# Patient Record
Sex: Male | Born: 1985 | Race: White | Hispanic: No | Marital: Married | Smoking: Never smoker
Health system: Southern US, Community
[De-identification: ages and names within clinical notes are randomized; demographics above are authoritative.]

---

## 2010-10-10 ENCOUNTER — Encounter: Admission: RE | Admit: 2010-10-10 | Discharge: 2010-10-10 | Payer: Self-pay | Admitting: Emergency Medicine

## 2014-05-10 ENCOUNTER — Encounter (HOSPITAL_COMMUNITY)
Admission: EM | Disposition: A | Discharge status: Home / Self Care | Payer: Self-pay | Source: Home / Self Care | Attending: Emergency Medicine

## 2014-05-10 ENCOUNTER — Encounter (HOSPITAL_COMMUNITY): Payer: Self-pay | Admitting: Emergency Medicine

## 2014-05-10 ENCOUNTER — Ambulatory Visit (HOSPITAL_COMMUNITY)
Admission: EM | Admit: 2014-05-10 | Discharge: 2014-05-11 | Disposition: A | Discharge status: Home / Self Care | Payer: 59 | Attending: General Surgery | Admitting: General Surgery

## 2014-05-10 ENCOUNTER — Emergency Department (HOSPITAL_COMMUNITY): Payer: 59

## 2014-05-10 ENCOUNTER — Encounter (HOSPITAL_COMMUNITY): Payer: 59 | Admitting: Certified Registered Nurse Anesthetist

## 2014-05-10 ENCOUNTER — Emergency Department (HOSPITAL_COMMUNITY): Payer: 59 | Admitting: Certified Registered Nurse Anesthetist

## 2014-05-10 DIAGNOSIS — K3580 Unspecified acute appendicitis: Secondary | ICD-10-CM | POA: Diagnosis present

## 2014-05-10 DIAGNOSIS — R109 Unspecified abdominal pain: Secondary | ICD-10-CM

## 2014-05-10 DIAGNOSIS — K5900 Constipation, unspecified: Secondary | ICD-10-CM

## 2014-05-10 DIAGNOSIS — K353 Acute appendicitis with localized peritonitis, without perforation or gangrene: Secondary | ICD-10-CM

## 2014-05-10 HISTORY — PX: LAPAROSCOPIC APPENDECTOMY: SHX408

## 2014-05-10 LAB — URINALYSIS, ROUTINE W REFLEX MICROSCOPIC
BILIRUBIN URINE: NEGATIVE
Glucose, UA: NEGATIVE mg/dL
HGB URINE DIPSTICK: NEGATIVE
Ketones, ur: NEGATIVE mg/dL
Leukocytes, UA: NEGATIVE
NITRITE: NEGATIVE
PROTEIN: 30 mg/dL — AB
SPECIFIC GRAVITY, URINE: 1.023 (ref 1.005–1.030)
UROBILINOGEN UA: 0.2 mg/dL (ref 0.0–1.0)
pH: 8.5 — ABNORMAL HIGH (ref 5.0–8.0)

## 2014-05-10 LAB — CBC WITH DIFFERENTIAL/PLATELET
Basophils Absolute: 0 10*3/uL (ref 0.0–0.1)
Basophils Relative: 0 % (ref 0–1)
EOS PCT: 1 % (ref 0–5)
Eosinophils Absolute: 0.1 10*3/uL (ref 0.0–0.7)
HEMATOCRIT: 43.2 % (ref 39.0–52.0)
HEMOGLOBIN: 15.2 g/dL (ref 13.0–17.0)
LYMPHS ABS: 1.2 10*3/uL (ref 0.7–4.0)
LYMPHS PCT: 9 % — AB (ref 12–46)
MCH: 29.5 pg (ref 26.0–34.0)
MCHC: 35.2 g/dL (ref 30.0–36.0)
MCV: 83.7 fL (ref 78.0–100.0)
MONO ABS: 0.9 10*3/uL (ref 0.1–1.0)
Monocytes Relative: 6 % (ref 3–12)
NEUTROS ABS: 11.9 10*3/uL — AB (ref 1.7–7.7)
Neutrophils Relative %: 84 % — ABNORMAL HIGH (ref 43–77)
Platelets: 176 10*3/uL (ref 150–400)
RBC: 5.16 MIL/uL (ref 4.22–5.81)
RDW: 11.9 % (ref 11.5–15.5)
WBC: 14.1 10*3/uL — AB (ref 4.0–10.5)

## 2014-05-10 LAB — COMPREHENSIVE METABOLIC PANEL
ALT: 50 U/L (ref 0–53)
AST: 22 U/L (ref 0–37)
Albumin: 4.5 g/dL (ref 3.5–5.2)
Alkaline Phosphatase: 68 U/L (ref 39–117)
BILIRUBIN TOTAL: 0.8 mg/dL (ref 0.3–1.2)
BUN: 11 mg/dL (ref 6–23)
CALCIUM: 9.5 mg/dL (ref 8.4–10.5)
CHLORIDE: 100 meq/L (ref 96–112)
CO2: 23 meq/L (ref 19–32)
CREATININE: 1.02 mg/dL (ref 0.50–1.35)
GLUCOSE: 112 mg/dL — AB (ref 70–99)
Potassium: 4 mEq/L (ref 3.7–5.3)
Sodium: 139 mEq/L (ref 137–147)
Total Protein: 7.6 g/dL (ref 6.0–8.3)

## 2014-05-10 LAB — URINE MICROSCOPIC-ADD ON

## 2014-05-10 SURGERY — APPENDECTOMY, LAPAROSCOPIC
Anesthesia: General | Site: Abdomen

## 2014-05-10 MED ORDER — MORPHINE SULFATE 4 MG/ML IJ SOLN
4.0000 mg | Freq: Once | INTRAMUSCULAR | Status: AC
  Administered 2014-05-10: 4 mg via INTRAVENOUS
  Filled 2014-05-10: qty 1

## 2014-05-10 MED ORDER — DEXAMETHASONE SODIUM PHOSPHATE 10 MG/ML IJ SOLN
INTRAMUSCULAR | Status: DC | PRN
  Administered 2014-05-10: 10 mg via INTRAVENOUS

## 2014-05-10 MED ORDER — ONDANSETRON HCL 4 MG/2ML IJ SOLN
4.0000 mg | Freq: Once | INTRAMUSCULAR | Status: AC
  Administered 2014-05-10: 4 mg via INTRAVENOUS
  Filled 2014-05-10: qty 2

## 2014-05-10 MED ORDER — FENTANYL CITRATE 0.05 MG/ML IJ SOLN
INTRAMUSCULAR | Status: AC
  Filled 2014-05-10: qty 5

## 2014-05-10 MED ORDER — MORPHINE SULFATE 2 MG/ML IJ SOLN
1.0000 mg | INTRAMUSCULAR | Status: DC | PRN
Start: 2014-05-10 — End: 2014-05-10

## 2014-05-10 MED ORDER — MEPERIDINE HCL 50 MG/ML IJ SOLN: 6.2500 mg | INTRAMUSCULAR | Status: DC | PRN

## 2014-05-10 MED ORDER — LACTATED RINGERS IV SOLN: INTRAVENOUS | Status: DC

## 2014-05-10 MED ORDER — PROPOFOL 10 MG/ML IV BOLUS
INTRAVENOUS | Status: AC
  Filled 2014-05-10: qty 20

## 2014-05-10 MED ORDER — PIPERACILLIN-TAZOBACTAM 3.375 G IVPB
3.3750 g | Freq: Three times a day (TID) | INTRAVENOUS | Status: DC
  Administered 2014-05-10 – 2014-05-11 (×2): 3.375 g via INTRAVENOUS
  Filled 2014-05-10 (×3): qty 50

## 2014-05-10 MED ORDER — FENTANYL CITRATE 0.05 MG/ML IJ SOLN
25.0000 ug | INTRAMUSCULAR | Status: DC | PRN
  Administered 2014-05-10 (×2): 50 ug via INTRAVENOUS

## 2014-05-10 MED ORDER — LACTATED RINGERS IR SOLN
Status: DC | PRN
  Administered 2014-05-10: 1000 mL

## 2014-05-10 MED ORDER — ONDANSETRON HCL 4 MG/2ML IJ SOLN: 4.0000 mg | Freq: Four times a day (QID) | INTRAMUSCULAR | Status: DC | PRN

## 2014-05-10 MED ORDER — HYDROMORPHONE HCL PF 1 MG/ML IJ SOLN: 1.0000 mg | INTRAMUSCULAR | Status: DC | PRN

## 2014-05-10 MED ORDER — NEOSTIGMINE METHYLSULFATE 10 MG/10ML IV SOLN
INTRAVENOUS | Status: DC | PRN
  Administered 2014-05-10: 5 mg via INTRAVENOUS

## 2014-05-10 MED ORDER — BUPIVACAINE-EPINEPHRINE (PF) 0.25% -1:200000 IJ SOLN
INTRAMUSCULAR | Status: AC
  Filled 2014-05-10: qty 30

## 2014-05-10 MED ORDER — LIDOCAINE HCL (CARDIAC) 20 MG/ML IV SOLN
INTRAVENOUS | Status: AC
  Filled 2014-05-10: qty 5

## 2014-05-10 MED ORDER — LACTATED RINGERS IV SOLN
INTRAVENOUS | Status: DC | PRN
  Administered 2014-05-10 (×2): via INTRAVENOUS

## 2014-05-10 MED ORDER — ONDANSETRON HCL 4 MG PO TABS
4.0000 mg | ORAL_TABLET | Freq: Four times a day (QID) | ORAL | Status: DC | PRN
Start: 2014-05-10 — End: 2014-05-11

## 2014-05-10 MED ORDER — ONDANSETRON HCL 4 MG/2ML IJ SOLN
INTRAMUSCULAR | Status: AC
  Filled 2014-05-10: qty 2

## 2014-05-10 MED ORDER — PIPERACILLIN-TAZOBACTAM 3.375 G IVPB
3.3750 g | INTRAVENOUS | Status: AC
  Administered 2014-05-10: 3.375 g via INTRAVENOUS
  Filled 2014-05-10: qty 50

## 2014-05-10 MED ORDER — FENTANYL CITRATE 0.05 MG/ML IJ SOLN
INTRAMUSCULAR | Status: AC
  Filled 2014-05-10: qty 2

## 2014-05-10 MED ORDER — OXYCODONE-ACETAMINOPHEN 5-325 MG PO TABS
1.0000 | ORAL_TABLET | ORAL | Status: DC | PRN
  Administered 2014-05-10 – 2014-05-11 (×2): 2 via ORAL
  Filled 2014-05-10 (×2): qty 2

## 2014-05-10 MED ORDER — DEXAMETHASONE SODIUM PHOSPHATE 10 MG/ML IJ SOLN
INTRAMUSCULAR | Status: AC
  Filled 2014-05-10: qty 1

## 2014-05-10 MED ORDER — GLYCOPYRROLATE 0.2 MG/ML IJ SOLN
INTRAMUSCULAR | Status: AC
  Filled 2014-05-10: qty 3

## 2014-05-10 MED ORDER — POTASSIUM CHLORIDE IN NACL 20-0.9 MEQ/L-% IV SOLN
INTRAVENOUS | Status: DC
  Administered 2014-05-11: via INTRAVENOUS
  Filled 2014-05-10 (×4): qty 1000

## 2014-05-10 MED ORDER — FENTANYL CITRATE 0.05 MG/ML IJ SOLN
INTRAMUSCULAR | Status: DC | PRN
  Administered 2014-05-10 (×5): 50 ug via INTRAVENOUS

## 2014-05-10 MED ORDER — LIDOCAINE HCL (CARDIAC) 20 MG/ML IV SOLN
INTRAVENOUS | Status: DC | PRN
  Administered 2014-05-10: 100 mg via INTRAVENOUS

## 2014-05-10 MED ORDER — 0.9 % SODIUM CHLORIDE (POUR BTL) OPTIME
TOPICAL | Status: DC | PRN
  Administered 2014-05-10: 1000 mL

## 2014-05-10 MED ORDER — MIDAZOLAM HCL 2 MG/2ML IJ SOLN
INTRAMUSCULAR | Status: AC
  Filled 2014-05-10: qty 2

## 2014-05-10 MED ORDER — NEOSTIGMINE METHYLSULFATE 10 MG/10ML IV SOLN
INTRAVENOUS | Status: AC
  Filled 2014-05-10: qty 1

## 2014-05-10 MED ORDER — PROMETHAZINE HCL 25 MG/ML IJ SOLN: 6.2500 mg | INTRAMUSCULAR | Status: DC | PRN

## 2014-05-10 MED ORDER — GLYCOPYRROLATE 0.2 MG/ML IJ SOLN
INTRAMUSCULAR | Status: DC | PRN
  Administered 2014-05-10: 0.6 mg via INTRAVENOUS

## 2014-05-10 MED ORDER — ROCURONIUM BROMIDE 100 MG/10ML IV SOLN
INTRAVENOUS | Status: AC
  Filled 2014-05-10: qty 1

## 2014-05-10 MED ORDER — BUPIVACAINE-EPINEPHRINE (PF) 0.5% -1:200000 IJ SOLN
INTRAMUSCULAR | Status: AC
  Filled 2014-05-10: qty 30

## 2014-05-10 MED ORDER — SUCCINYLCHOLINE CHLORIDE 20 MG/ML IJ SOLN
INTRAMUSCULAR | Status: DC | PRN
  Administered 2014-05-10: 100 mg via INTRAVENOUS

## 2014-05-10 MED ORDER — PROPOFOL 10 MG/ML IV BOLUS
INTRAVENOUS | Status: DC | PRN
  Administered 2014-05-10: 200 mg via INTRAVENOUS

## 2014-05-10 MED ORDER — BUPIVACAINE-EPINEPHRINE (PF) 0.5% -1:200000 IJ SOLN
INTRAMUSCULAR | Status: DC | PRN
  Administered 2014-05-10: 15 mL

## 2014-05-10 MED ORDER — ENOXAPARIN SODIUM 40 MG/0.4ML ~~LOC~~ SOLN
40.0000 mg | SUBCUTANEOUS | Status: DC
  Administered 2014-05-11: 40 mg via SUBCUTANEOUS
  Filled 2014-05-10 (×2): qty 0.4

## 2014-05-10 MED ORDER — ROCURONIUM BROMIDE 100 MG/10ML IV SOLN
INTRAVENOUS | Status: DC | PRN
  Administered 2014-05-10: 10 mg via INTRAVENOUS
  Administered 2014-05-10: 35 mg via INTRAVENOUS

## 2014-05-10 MED ORDER — ONDANSETRON HCL 4 MG/2ML IJ SOLN
INTRAMUSCULAR | Status: DC | PRN
  Administered 2014-05-10: 4 mg via INTRAVENOUS

## 2014-05-10 MED ORDER — MIDAZOLAM HCL 5 MG/5ML IJ SOLN
INTRAMUSCULAR | Status: DC | PRN
  Administered 2014-05-10: 2 mg via INTRAVENOUS

## 2014-05-10 MED ORDER — SODIUM CHLORIDE 0.9 % IV BOLUS (SEPSIS)
1000.0000 mL | Freq: Once | INTRAVENOUS | Status: AC
  Administered 2014-05-10: 1000 mL via INTRAVENOUS

## 2014-05-10 SURGICAL SUPPLY — 38 items
APPLIER CLIP ROT 10 11.4 M/L (STAPLE)
CANISTER SUCTION 2500CC (MISCELLANEOUS) ×3 IMPLANT
CLIP APPLIE ROT 10 11.4 M/L (STAPLE) IMPLANT
CLOSURE WOUND 1/2 X4 (GAUZE/BANDAGES/DRESSINGS)
CUTTER FLEX LINEAR 45M (STAPLE) ×3 IMPLANT
DECANTER SPIKE VIAL GLASS SM (MISCELLANEOUS) ×3 IMPLANT
DERMABOND ADVANCED (GAUZE/BANDAGES/DRESSINGS) ×2
DERMABOND ADVANCED .7 DNX12 (GAUZE/BANDAGES/DRESSINGS) ×1 IMPLANT
DEVICE TROCAR PUNCTURE CLOSURE (ENDOMECHANICALS) IMPLANT
DRAPE LAPAROSCOPIC ABDOMINAL (DRAPES) ×3 IMPLANT
ELECT REM PT RETURN 9FT ADLT (ELECTROSURGICAL) ×3
ELECTRODE REM PT RTRN 9FT ADLT (ELECTROSURGICAL) ×1 IMPLANT
ENDOLOOP SUT PDS II  0 18 (SUTURE)
ENDOLOOP SUT PDS II 0 18 (SUTURE) IMPLANT
GLOVE BIOGEL PI IND STRL 7.0 (GLOVE) ×1 IMPLANT
GLOVE BIOGEL PI INDICATOR 7.0 (GLOVE) ×2
GLOVE EUDERMIC 7 POWDERFREE (GLOVE) ×3 IMPLANT
GOWN STRL REUS W/TWL LRG LVL3 (GOWN DISPOSABLE) ×3 IMPLANT
GOWN STRL REUS W/TWL XL LVL3 (GOWN DISPOSABLE) ×6 IMPLANT
KIT BASIN OR (CUSTOM PROCEDURE TRAY) ×3 IMPLANT
PENCIL BUTTON HOLSTER BLD 10FT (ELECTRODE) ×3 IMPLANT
POUCH SPECIMEN RETRIEVAL 10MM (ENDOMECHANICALS) ×3 IMPLANT
RELOAD 45 VASCULAR/THIN (ENDOMECHANICALS) ×3 IMPLANT
RELOAD STAPLE TA45 3.5 REG BLU (ENDOMECHANICALS) IMPLANT
SET IRRIG TUBING LAPAROSCOPIC (IRRIGATION / IRRIGATOR) ×3 IMPLANT
SHEARS HARMONIC ACE PLUS 36CM (ENDOMECHANICALS) IMPLANT
SOLUTION ANTI FOG 6CC (MISCELLANEOUS) ×3 IMPLANT
STRIP CLOSURE SKIN 1/2X4 (GAUZE/BANDAGES/DRESSINGS) IMPLANT
SUT MNCRL AB 4-0 PS2 18 (SUTURE) ×3 IMPLANT
SUT VICRYL 0 UR6 27IN ABS (SUTURE) IMPLANT
TOWEL OR 17X26 10 PK STRL BLUE (TOWEL DISPOSABLE) ×3 IMPLANT
TRAY FOLEY CATH 14FRSI W/METER (CATHETERS) IMPLANT
TRAY FOLEY CATH 16FR SILVER (SET/KITS/TRAYS/PACK) ×3 IMPLANT
TRAY LAP CHOLE (CUSTOM PROCEDURE TRAY) ×3 IMPLANT
TROCAR BLADELESS OPT 5 75 (ENDOMECHANICALS) ×3 IMPLANT
TROCAR XCEL 12X100 BLDLESS (ENDOMECHANICALS) ×3 IMPLANT
TROCAR XCEL BLUNT TIP 100MML (ENDOMECHANICALS) ×3 IMPLANT
TUBING INSUFFLATION 10FT LAP (TUBING) ×3 IMPLANT

## 2014-05-10 NOTE — Progress Notes (Signed)
CARE MANAGEMENT ED NOTE 05/10/2014  Patient:  Craig Clark,Craig Clark   Account Number:  [id number]  Date Initiated:  05/10/2014  Documentation initiated by:  Edd ArbourGIBBS,KIMBERLY  Subjective/Objective Assessment:   28 yr old [place] pt c/o abdominal pain x 12 hrs, nausea Dx acute appendicitis to OR with Dr Edythe LynnH Ingram     Subjective/Objective Assessment Detail:   No pcp per pt Pt left [facility] ED prior to being given a list of pcps     Action/Plan:   ED CM spoke with pt CM discussed need closer to d/c  to find a pcp for f/u care   Action/Plan Detail:   Anticipated DC Date:       Status Recommendation to Physician:   Result of Recommendation:    Other ED Services  Consult Working Plan    DC Planning Services  Other  PCP issues  Outpatient Services - Pt will follow up    Choice offered to / List presented to:            Status of service:  Completed, signed off  ED Comments:   ED Comments Detail:

## 2014-05-10 NOTE — ED Provider Notes (Signed)
CSN: [id number]     Arrival date & time 05/10/14  [id number] History   First MD Initiated Contact with Patient 05/10/14 [phone]     Chief Complaint  Patient presents with  . Abdominal Pain     (Consider location/radiation/quality/duration/timing/severity/associated sxs/prior Treatment) HPI Comments: Pt presents with right side abd pain.  States that it started last night, about 12 hours ago, has been constant, but waxes and wanes in intensity.  Mild nausea, but no vomiting.  No urinary symptoms.  No fevers or chills.  Pain is achey and radiates at times to back and right groin.  Has been worse with ambulation.  No hx of abd surgery, no hx of kidney stones.  Has not taken anything for the pain.  Patient is a 28 y.o. male presenting with abdominal pain.  Abdominal Pain Associated symptoms: nausea   Associated symptoms: no chest pain, no chills, no cough, no diarrhea, no fatigue, no fever, no hematuria, no shortness of breath and no vomiting     History reviewed. No pertinent past medical history. History reviewed. No pertinent past surgical history. No family history on file. History  Substance Use Topics  . Smoking status: Never Smoker   . Smokeless tobacco: Not on file  . Alcohol Use: No    Review of Systems  Constitutional: Negative for fever, chills, diaphoresis and fatigue.  HENT: Negative for congestion, rhinorrhea and sneezing.   Eyes: Negative.   Respiratory: Negative for cough, chest tightness and shortness of breath.   Cardiovascular: Negative for chest pain and leg swelling.  Gastrointestinal: Positive for nausea and abdominal pain. Negative for vomiting, diarrhea and blood in stool.  Genitourinary: Negative for frequency, hematuria, flank pain and difficulty urinating.  Musculoskeletal: Negative for arthralgias and back pain.  Skin: Negative for rash.  Neurological: Negative for dizziness, speech difficulty, weakness, numbness and headaches.      Allergies   Iohexol  Home Medications   Prior to Admission medications   Medication Sig Start Date End Date Taking? Authorizing Provider  acetaminophen (TYLENOL) 500 MG tablet Take 500 mg by mouth every 6 (six) hours as needed.   Yes Historical Provider, MD  Aspirin-Acetaminophen-Caffeine (EXCEDRIN PO) Take by mouth.   Yes Historical Provider, MD  hydrocortisone cream 1 % Apply 1 application topically as needed for itching (rash).   Yes Historical Provider, MD  ibuprofen (ADVIL,MOTRIN) 200 MG tablet Take 200 mg by mouth every 6 (six) hours as needed for mild pain.   Yes Historical Provider, MD   BP 121/69  Pulse 78  Temp(Src) 98.5 F (36.9 C) (Oral)  Resp 16  SpO2 100% Physical Exam  Constitutional: He is oriented to person, place, and time. He appears well-developed and well-nourished.  HENT:  Head: Normocephalic and atraumatic.  Eyes: Pupils are equal, round, and reactive to light.  Neck: Normal range of motion. Neck supple.  Cardiovascular: Normal rate, regular rhythm and normal heart sounds.   Pulmonary/Chest: Effort normal and breath sounds normal. No respiratory distress. He has no wheezes. He has no rales. He exhibits no tenderness.  Abdominal: Soft. Bowel sounds are normal. There is tenderness (+TTP RLQ, mild right flank tenderness). There is no rebound and no guarding.  Genitourinary:  No pain over inguinal area or testicle.  Musculoskeletal: Normal range of motion. He exhibits no edema.  Lymphadenopathy:    He has no cervical adenopathy.  Neurological: He is alert and oriented to person, place, and time.  Skin: Skin is warm and dry. No rash noted.  Psychiatric: He has a normal mood and affect.    ED Course  Procedures (including critical care time) Labs Review Results for orders placed during the hospital encounter of 05/10/14  CBC WITH DIFFERENTIAL      Result Value Ref Range   WBC 14.1 (*) 4.0 - 10.5 K/uL   RBC 5.16  4.22 - 5.81 MIL/uL   Hemoglobin 15.2  13.0 - 17.0 g/dL    HCT 16.1  09.6 - 04.5 %   MCV 83.7  78.0 - 100.0 fL   MCH 29.5  26.0 - 34.0 pg   MCHC 35.2  30.0 - 36.0 g/dL   RDW 40.9  81.1 - 91.4 %   Platelets 176  150 - 400 K/uL   Neutrophils Relative % 84 (*) 43 - 77 %   Neutro Abs 11.9 (*) 1.7 - 7.7 K/uL   Lymphocytes Relative 9 (*) 12 - 46 %   Lymphs Abs 1.2  0.7 - 4.0 K/uL   Monocytes Relative 6  3 - 12 %   Monocytes Absolute 0.9  0.1 - 1.0 K/uL   Eosinophils Relative 1  0 - 5 %   Eosinophils Absolute 0.1  0.0 - 0.7 K/uL   Basophils Relative 0  0 - 1 %   Basophils Absolute 0.0  0.0 - 0.1 K/uL  COMPREHENSIVE METABOLIC PANEL      Result Value Ref Range   Sodium 139  137 - 147 mEq/L   Potassium 4.0  3.7 - 5.3 mEq/L   Chloride 100  96 - 112 mEq/L   CO2 23  19 - 32 mEq/L   Glucose, Bld 112 (*) 70 - 99 mg/dL   BUN 11  6 - 23 mg/dL   Creatinine, Ser 7.82  0.50 - 1.35 mg/dL   Calcium 9.5  8.4 - 95.6 mg/dL   Total Protein 7.6  6.0 - 8.3 g/dL   Albumin 4.5  3.5 - 5.2 g/dL   AST 22  0 - 37 U/L   ALT 50  0 - 53 U/L   Alkaline Phosphatase 68  39 - 117 U/L   Total Bilirubin 0.8  0.3 - 1.2 mg/dL   GFR calc non Af Amer >90  >90 mL/min   GFR calc Af Amer >90  >90 mL/min  URINALYSIS, ROUTINE W REFLEX MICROSCOPIC      Result Value Ref Range   Color, Urine YELLOW  YELLOW   APPearance CLEAR  CLEAR   Specific Gravity, Urine 1.023  1.005 - 1.030   pH 8.5 (*) 5.0 - 8.0   Glucose, UA NEGATIVE  NEGATIVE mg/dL   Hgb urine dipstick NEGATIVE  NEGATIVE   Bilirubin Urine NEGATIVE  NEGATIVE   Ketones, ur NEGATIVE  NEGATIVE mg/dL   Protein, ur 30 (*) NEGATIVE mg/dL   Urobilinogen, UA 0.2  0.0 - 1.0 mg/dL   Nitrite NEGATIVE  NEGATIVE   Leukocytes, UA NEGATIVE  NEGATIVE  URINE MICROSCOPIC-ADD ON      Result Value Ref Range   WBC, UA 0-2  <3 WBC/hpf   RBC / HPF 3-6  <3 RBC/hpf   Ct Abdomen Pelvis Wo Contrast  05/10/2014   CLINICAL DATA:  Flank pain.  EXAM: CT ABDOMEN AND PELVIS WITHOUT CONTRAST  TECHNIQUE: Multidetector CT imaging of the abdomen and  pelvis was performed following the standard protocol without IV contrast.  COMPARISON:  CT 10/10/2010.  FINDINGS: Liver normal. Spleen normal. Pancreas normal. No biliary distention. The gallbladder is nondistended.  Adrenals normal. Kidneys normal. No  hydronephrosis or obstructing ureteral stone. Bladder is nondistended. Prostate is not enlarged.  No significant adenopathy.  Abdominal aorta normal caliber.  Appendix is distended to 1.3 cm. Periappendiceal inflammatory change present. These findings are consistent with appendicitis. No evidence of bowel obstruction. No free air. No mesenteric mass. No significant hernia. Small inguinal hernias with herniation of fat only noted.  Heart size normal. Mild atelectasis left lung base. No acute bony abnormality.  IMPRESSION: Appendicitis.   Electronically Signed   By: Maisie Fushomas  Register   On: 05/10/2014 10:54      Imaging Review Ct Abdomen Pelvis Wo Contrast  05/10/2014   CLINICAL DATA:  Flank pain.  EXAM: CT ABDOMEN AND PELVIS WITHOUT CONTRAST  TECHNIQUE: Multidetector CT imaging of the abdomen and pelvis was performed following the standard protocol without IV contrast.  COMPARISON:  CT 10/10/2010.  FINDINGS: Liver normal. Spleen normal. Pancreas normal. No biliary distention. The gallbladder is nondistended.  Adrenals normal. Kidneys normal. No hydronephrosis or obstructing ureteral stone. Bladder is nondistended. Prostate is not enlarged.  No significant adenopathy.  Abdominal aorta normal caliber.  Appendix is distended to 1.3 cm. Periappendiceal inflammatory change present. These findings are consistent with appendicitis. No evidence of bowel obstruction. No free air. No mesenteric mass. No significant hernia. Small inguinal hernias with herniation of fat only noted.  Heart size normal. Mild atelectasis left lung base. No acute bony abnormality.  IMPRESSION: Appendicitis.   Electronically Signed   By: Maisie Fushomas  Register   On: 05/10/2014 10:54     EKG  Interpretation None      MDM   Final diagnoses:  Acute appendicitis with localized peritonitis    Pt with appendicitis.  Surgery notified and will see pt.    Rolan BuccoMelanie Belfi, MD 05/10/14 1143

## 2014-05-10 NOTE — Anesthesia Preprocedure Evaluation (Signed)
Anesthesia Evaluation  Patient identified by MRN, date of birth, ID band Patient awake    Reviewed: Allergy & Precautions, H&P , NPO status , Patient's Chart, lab work & pertinent test results  Airway Mallampati: II TM Distance: >3 FB Neck ROM: full    Dental no notable dental hx.    Pulmonary neg pulmonary ROS,  breath sounds clear to auscultation  Pulmonary exam normal       Cardiovascular Exercise Tolerance: Good negative cardio ROS  Rhythm:regular Rate:Normal     Neuro/Psych negative neurological ROS  negative psych ROS   GI/Hepatic negative GI ROS, Neg liver ROS,   Endo/Other  negative endocrine ROS  Renal/GU negative Renal ROS  negative genitourinary   Musculoskeletal   Abdominal   Peds  Hematology negative hematology ROS (+)   Anesthesia Other Findings   Reproductive/Obstetrics negative OB ROS                           Anesthesia Physical Anesthesia Plan  ASA: I and emergent  Anesthesia Plan: General and General ETT   Post-op Pain Management:    Induction:   Airway Management Planned:   Additional Equipment:   Intra-op Plan:   Post-operative Plan:   Informed Consent: I have reviewed the patients History and Physical, chart, labs and discussed the procedure including the risks, benefits and alternatives for the proposed anesthesia with the patient or authorized representative who has indicated his/her understanding and acceptance.   Dental Advisory Given  Plan Discussed with: CRNA  Anesthesia Plan Comments:         Anesthesia Quick Evaluation

## 2014-05-10 NOTE — ED Notes (Signed)
Patient transported to Ultrasound 

## 2014-05-10 NOTE — Anesthesia Postprocedure Evaluation (Signed)
  Anesthesia Post-op Note  Patient: Craig SaltsDavid Clark  Procedure(s) Performed: Procedure(s) (LRB): APPENDECTOMY LAPAROSCOPIC (N/A)  Patient Location: PACU  Anesthesia Type: General  Level of Consciousness: awake and alert   Airway and Oxygen Therapy: Patient Spontanous Breathing  Post-op Pain: mild  Post-op Assessment: Post-op Vital signs reviewed, Patient's Cardiovascular Status Stable, Respiratory Function Stable, Patent Airway and No signs of Nausea or vomiting  Last Vitals:  Filed Vitals:   05/10/14 1614  BP: 115/67  Pulse: 70  Temp: 37 C  Resp: 16    Post-op Vital Signs: stable   Complications: No apparent anesthesia complications

## 2014-05-10 NOTE — Transfer of Care (Signed)
Immediate Anesthesia Transfer of Care Note  Patient: Craig SaltsDavid Hofferber  Procedure(s) Performed: Procedure(s) (LRB): APPENDECTOMY LAPAROSCOPIC (N/A)  Patient Location: PACU  Anesthesia Type: General  Level of Consciousness: sedated, patient cooperative and responds to stimulation  Airway & Oxygen Therapy: Patient Spontanous Breathing and Patient connected to face mask oxgen  Post-op Assessment: Report given to PACU RN and Post -op Vital signs reviewed and stable  Post vital signs: Reviewed and stable  Complications: No apparent anesthesia complications

## 2014-05-10 NOTE — Op Note (Signed)
Patient Name:           Harlene SaltsDavid Baynes   Date of Surgery:        05/10/2014  Pre op Diagnosis:      Acute appendicitis  Post op Diagnosis:    Acute appendicitis  Procedure:                 Laparoscopic appendectomy  Surgeon:                     Angelia MouldHaywood M. Derrell LollingIngram, M.D., FACS  Assistant:                      None  Operative Indications:   This is a overweight but healthy 28 year old gentleman who percent with a 12 hour history of right lower quadrant pain, anorexia, nausea. No diarrhea. No prior episodes. Examination revealed right lower tenderness with guarding. CT scan shows an inflamed appendix but no evidence of rupture or abscess. He is brought to the operating room urgently  Operative Findings:       The patient had acute appendicitis. The appendix was inflamed thickened and had a little bit of exudate on it. There is no evidence of perforation and no evidence of gangrene. The sigmoid colon, terminal ileum, cecum, right colon liver all looked normal.  Procedure in Detail:          Following the induction of general endotracheal anesthesia a Foley catheter was placed and  the abdomen was prepped and draped in a sterile fashion. He had received intravenous antibiotics preop. Surgical time out was performed. 0.5% Marcaine with epinephrine was used as local infiltration anesthetic. A vertical incision was made at the upper rim of the umbilicus. The fascia was incised in the midline. The abdominal cavity was entered under direct vision. An 11 mm Hassan trocar was inserted and secured with a pursestring suture of 0 Vicryl. Pneumoperitoneum was created video camera was inserted. 5 mm trocar was placed in the left lower quadrant and a 12 mm trocar placed the suprapubic area to the left of the midline. Patient was positioned. The terminal ileum, ileocecal valve, appendix and cecum were identified and  the anatomy clarified. I lifted up the appendix.  I divided the lateral peritoneal attachments. The  appendiceal mesentery was divided in small steps with the harmonic scalpel until I could see the junction of the appendix with the cecum. Endo GIA stapler was placed across the base of the appendix and fired. The initial initial firing actually misfired I removed the stapler otherwise intact. The lumen had not been entered.  There was no contamination. I reinserted a second staple load and then inserted that and moved it back and took a little cuff of cecum. The stapling fired normally. The appendix and the cuff of cecum were then placed in a specimen bag and removed. The operative field was inspected and irrigated. The staple line looked very good and intact. There is no bleeding. There were no loose staples. Irrigation fluid was removed. Trocars were removed and there was no bleeding from trocar sites. The pneumoperitoneum was released. The fascia at the umbilicus was closed with 0 Vicryl sutures. Skin incisions were closed with subcuticular sutures of 4-0 Monocryl and Dermabond. The patient tolerated the procedure well taken to PACU in stable condition. EBL 10 cc. Counts correct. Complications none.     Angelia MouldHaywood M. Derrell LollingIngram, M.D., FACS General and Minimally Invasive Surgery Breast and Colorectal Surgery  05/10/2014 2:12  PM

## 2014-05-10 NOTE — ED Notes (Signed)
Patient with right lower abdominal pain for the last 12 hours.  Pain has worsened.  No vomiting but constant nausea.  Small normal bowel movement at 6AM.  Yesterday he had a few diarrhea stools.  Patient reports he ate lots of food yesterday.

## 2014-05-10 NOTE — H&P (Signed)
Craig Clark 23-Oct-1986  [id number].   Chief Complaint/Reason for Consult: acute appendicitis HPI: This is a 28 yo white male who began having some abdominal pain last night around 8 or 9pm. He thought it was indigestion, but began worsening around 11:30pm.  He tried to sleep, but kept waking up due to pain.  He had some nausea, but no emesis.  He has been constipated.  Denies fevers, but admits to chills.  He presented to [facility] where he was worked up and found to have acute appendicitis  ROS: Please see HPI, otherwise negative  History reviewed. No pertinent family history.  History reviewed. No pertinent past medical history.  History reviewed. No pertinent past surgical history.  Social History:  reports that he has never smoked. He does not have any smokeless tobacco history on file. He reports that he does not drink alcohol or use illicit drugs.  Allergies:  Allergies  Allergen Reactions  . Iohexol      Code: HIVES, Desc: pt broke out in one hive on his forehead, had itchinig of his left eye and chest tightness. pt was given 50 mg benadryl and observed for 30 mins. needs premeds in the future per dr Zigmund Daniel., Onset Date: [id number]      (Not in a hospital admission)  Blood pressure 115/66, pulse 85, temperature 97.6 F (36.4 C), temperature source Oral, resp. rate 16, SpO2 98.00%. Physical Exam: General: pleasant, WD, WN white male who is laying in bed in NAD HEENT: head is normocephalic, atraumatic.  Sclera are noninjected.  PERRL.  Ears and nose without any masses or lesions.  Mouth is pink and moist Heart: regular, rate, and rhythm.  Normal s1,s2. No obvious murmurs, gallops, or rubs noted.  Palpable radial and pedal pulses bilaterally Lungs: CTAB, no wheezes, rhonchi, or rales noted.  Respiratory effort nonlabored Abd: soft, tender in RLQ, ND, +BS, no masses, hernias, or organomegaly MS: all 4 extremities are symmetrical with no cyanosis, clubbing, or edema. Skin: warm and  dry with no masses, lesions, or rashes Psych: A&Ox3 with an appropriate affect.    Results for orders placed during the hospital encounter of 05/10/14 (from the past 48 hour(s))  CBC WITH DIFFERENTIAL     Status: Abnormal   Collection Time    05/10/14  9:42 AM      Result Value Ref Range   WBC 14.1 (*) 4.0 - 10.5 K/uL   RBC 5.16  4.22 - 5.81 MIL/uL   Hemoglobin 15.2  13.0 - 17.0 g/dL   HCT 43.2  39.0 - 52.0 %   MCV 83.7  78.0 - 100.0 fL   MCH 29.5  26.0 - 34.0 pg   MCHC 35.2  30.0 - 36.0 g/dL   RDW 11.9  11.5 - 15.5 %   Platelets 176  150 - 400 K/uL   Neutrophils Relative % 84 (*) 43 - 77 %   Neutro Abs 11.9 (*) 1.7 - 7.7 K/uL   Lymphocytes Relative 9 (*) 12 - 46 %   Lymphs Abs 1.2  0.7 - 4.0 K/uL   Monocytes Relative 6  3 - 12 %   Monocytes Absolute 0.9  0.1 - 1.0 K/uL   Eosinophils Relative 1  0 - 5 %   Eosinophils Absolute 0.1  0.0 - 0.7 K/uL   Basophils Relative 0  0 - 1 %   Basophils Absolute 0.0  0.0 - 0.1 K/uL  COMPREHENSIVE METABOLIC PANEL     Status: Abnormal   Collection Time  05/10/14  9:42 AM      Result Value Ref Range   Sodium 139  137 - 147 mEq/L   Potassium 4.0  3.7 - 5.3 mEq/L   Chloride 100  96 - 112 mEq/L   CO2 23  19 - 32 mEq/L   Glucose, Bld 112 (*) 70 - 99 mg/dL   BUN 11  6 - 23 mg/dL   Creatinine, Ser 1.02  0.50 - 1.35 mg/dL   Calcium 9.5  8.4 - 10.5 mg/dL   Total Protein 7.6  6.0 - 8.3 g/dL   Albumin 4.5  3.5 - 5.2 g/dL   AST 22  0 - 37 U/L   ALT 50  0 - 53 U/L   Alkaline Phosphatase 68  39 - 117 U/L   Total Bilirubin 0.8  0.3 - 1.2 mg/dL   GFR calc non Af Amer >90  >90 mL/min   GFR calc Af Amer >90  >90 mL/min   Comment: (NOTE)     The eGFR has been calculated using the CKD EPI equation.     This calculation has not been validated in all clinical situations.     eGFR's persistently <90 mL/min signify possible Chronic Kidney     Disease.  URINALYSIS, ROUTINE W REFLEX MICROSCOPIC     Status: Abnormal   Collection Time    05/10/14 11:03  AM      Result Value Ref Range   Color, Urine YELLOW  YELLOW   APPearance CLEAR  CLEAR   Specific Gravity, Urine 1.023  1.005 - 1.030   pH 8.5 (*) 5.0 - 8.0   Glucose, UA NEGATIVE  NEGATIVE mg/dL   Hgb urine dipstick NEGATIVE  NEGATIVE   Bilirubin Urine NEGATIVE  NEGATIVE   Ketones, ur NEGATIVE  NEGATIVE mg/dL   Protein, ur 30 (*) NEGATIVE mg/dL   Urobilinogen, UA 0.2  0.0 - 1.0 mg/dL   Nitrite NEGATIVE  NEGATIVE   Leukocytes, UA NEGATIVE  NEGATIVE  URINE MICROSCOPIC-ADD ON     Status: None   Collection Time    05/10/14 11:03 AM      Result Value Ref Range   WBC, UA 0-2  <3 WBC/hpf   RBC / HPF 3-6  <3 RBC/hpf   Ct Abdomen Pelvis Wo Contrast  05/10/2014   CLINICAL DATA:  Flank pain.  EXAM: CT ABDOMEN AND PELVIS WITHOUT CONTRAST  TECHNIQUE: Multidetector CT imaging of the abdomen and pelvis was performed following the standard protocol without IV contrast.  COMPARISON:  CT 10/10/2010.  FINDINGS: Liver normal. Spleen normal. Pancreas normal. No biliary distention. The gallbladder is nondistended.  Adrenals normal. Kidneys normal. No hydronephrosis or obstructing ureteral stone. Bladder is nondistended. Prostate is not enlarged.  No significant adenopathy.  Abdominal aorta normal caliber.  Appendix is distended to 1.3 cm. Periappendiceal inflammatory change present. These findings are consistent with appendicitis. No evidence of bowel obstruction. No free air. No mesenteric mass. No significant hernia. Small inguinal hernias with herniation of fat only noted.  Heart size normal. Mild atelectasis left lung base. No acute bony abnormality.  IMPRESSION: Appendicitis.   Electronically Signed   By: Marcello Moores  Register   On: 05/10/2014 10:54       Assessment/Plan 1. Acute appendicitis  Plan: 1. We will get the patient admitted and proceed to the operating room for lap appy.  Dr. Dalbert Batman has discussed this with the patient and his wife.  They understand and are willing to proceed.  All questions  answered.  OSBORNE,KELLY E 05/10/2014, 12:17 PM Pager: [phone]

## 2014-05-10 NOTE — ED Notes (Signed)
Pt c/o right sided abd pain x 12 hours, also c/o nausea.

## 2014-05-10 NOTE — H&P (Signed)
I have interviewed and examined this patient. His history, physical findings, laboratory evaluation and CT scan are consistent with uncomplicated acute appendicitis.  I discussed the indications, details, techniques, and numerous risks of laparoscopic appendectomy, possible open with him and his wife. He is aware of the risk of bleeding, infection, conversion to open laparotomy, injury to adjacent organs, wound healing problems such as hernia, reoperation for complications, and other unforeseen problems. He understands all these issues. All his questions were answered. He agrees with this plan.  We'll proceed with surgery soon.  Angelia MouldHaywood M. Derrell LollingIngram, M.D., [facility] General and Minimally invasive Surgery Breast and Colorectal Surgery Office:   [phone] Pager:   [phone]

## 2014-05-10 NOTE — Progress Notes (Signed)
ANTIBIOTIC CONSULT NOTE - INITIAL  Pharmacy Consult for Zosyn Indication: Acute pancreatitis   Allergies  Allergen Reactions  . Iohexol      Code: HIVES, Desc: pt broke out in one hive on his forehead, had itchinig of his left eye and chest tightness. pt was given 50 mg benadryl and observed for 30 mins. needs premeds in the future per dr Jena Gaussmaxwell., Onset Date: [id number]     Patient Measurements: Height: 5' 10.5" (179.1 cm) Weight: 280 lb (127.007 kg) IBW/kg (Calculated) : 74.15   Vital Signs: Temp: 98.2 F (36.8 C) (06/22 1517) Temp src: Oral (06/22 1145) BP: 120/78 mmHg (06/22 1517) Pulse Rate: 69 (06/22 1517) Intake/Output from previous day:   Intake/Output from this shift: Total I/O In: 1500 [I.V.:1500] Out: 250 [Urine:250]  Labs:  Recent Labs  05/10/14 0942  WBC 14.1*  HGB 15.2  PLT 176  CREATININE 1.02   Estimated Creatinine Clearance: 144 ml/min (by C-G formula based on Cr of 1.02). No results found for this basename: VANCOTROUGH, VANCOPEAK, VANCORANDOM, GENTTROUGH, GENTPEAK, GENTRANDOM, TOBRATROUGH, TOBRAPEAK, TOBRARND, AMIKACINPEAK, AMIKACINTROU, AMIKACIN,  in the last 72 hours   Microbiology: No results found for this or any previous visit (from the past 720 hour(s)).  Medical History: History reviewed. No pertinent past medical history.  Medications:  Scheduled:  . enoxaparin (LOVENOX) injection  40 mg Subcutaneous Q24H  . fentaNYL       Infusions:  . 0.9 % NaCl with KCl 20 mEq / L     PRN: HYDROmorphone (DILAUDID) injection, ondansetron (ZOFRAN) IV, ondansetron, oxyCODONE-acetaminophen Assessment: 28 yo M with acute appendicitis s/p appendectomy on June 22.   Patient received zosyn pre-operatively, to continue post-operatively.   Patient's renal function is WNL  Goal of Therapy:  Zosyn Per renal function   Plan:  1.) Zosyn 3.375 grams IV Q8h, infuse over 4 hours.  Next dose due at 2000 2.) Monitor renal function  Borgerding, Loma MessingMary Patricia  PharmD Pager #: [phone] 3:54 PM 05/10/2014

## 2014-05-11 ENCOUNTER — Encounter (HOSPITAL_COMMUNITY): Payer: Self-pay | Admitting: General Surgery

## 2014-05-11 MED ORDER — HYDROCODONE-ACETAMINOPHEN 5-325 MG PO TABS: 1.0000 | ORAL_TABLET | Freq: Four times a day (QID) | ORAL | Status: AC | PRN

## 2014-05-11 NOTE — Discharge Instructions (Signed)
See above

## 2014-05-11 NOTE — Progress Notes (Signed)
Nurse reviewed discharge instructions with pt.  Pt verbalized understanding of discharge instructions and new medications.No concerns at time of discharge. 

## 2014-05-11 NOTE — Discharge Summary (Signed)
  Patient ID: Craig SaltsDavid Bazen [id number] 27 y.o. [birth year]  Admit date: 05/10/2014  Discharge date and time: 05/11/2014  Admitting Physician: Ernestene MentionINGRAM,HAYWOOD M  Discharge Physician: Ernestene MentionINGRAM,HAYWOOD M  Admission Diagnoses: Acute appendicitis with localized peritonitis [540.1]  Discharge Diagnoses: Same  Operations: Procedure(s): APPENDECTOMY LAPAROSCOPIC  Admission Condition: good  Discharged Condition: good  Indication for Admission: This is a overweight but healthy 28 year old gentleman who percent with a 12 hour history of right lower quadrant pain, anorexia, nausea. No diarrhea. No prior episodes. Examination revealed right lower tenderness with guarding. CT scan shows an inflamed appendix but no evidence of rupture or abscess. He Was admitted for appendectomy   Hospital Course: On the day of admission the patient was taken to the operating room and underwent laparoscopic appendectomy. Findings were thickened inflamed appendix but no evidence of gangrene, abscess, or perforation. Gen. Abdominal survey was otherwise normal. Pathology is pending. Postoperatively the patient did well. He progressed in diet and activities without any problems. He had no difficulty voiding. Pain was well-controlled. On them morning after surgery he was alert and stable. Abdomen was soft and minimal incisional tenderness. The wounds looked fine. At the time of discharge he was given a prescription for Norco for pain. Diet and activities were discussed. He was advised to return to see Dr. Derrell LollingIngram in the office in 3 weeks.  Consults: None  Significant Diagnostic Studies: CT scan abdomen. Surgical pathology, pending  Treatments: surgery: Laparoscopic appendectomy  Disposition: Home  Patient Instructions:    Medication List         acetaminophen 500 MG tablet  Commonly known as:  TYLENOL  Take 500 mg by mouth every 6 (six) hours as needed.     EXCEDRIN PO  Take by mouth.     HYDROcodone-acetaminophen 5-325 MG per tablet  Commonly known as:  NORCO  Take 1-2 tablets by mouth every 6 (six) hours as needed.     hydrocortisone cream 1 %  Apply 1 application topically as needed for itching (rash).     ibuprofen 200 MG tablet  Commonly known as:  ADVIL,MOTRIN  Take 200 mg by mouth every 6 (six) hours as needed for mild pain.        Activity: no sports or heavy lifting for 2 weeks. Unlimited ambulation. May drive car when comfortable. May return to work in one week, and comfortable. Diet: low fat, low cholesterol diet Wound Care: none needed  Follow-up:  With Dr. Derrell LollingIngram in 3 weeks.  Signed: Angelia MouldHaywood M. Derrell LollingIngram, M.D., FACS General and minimally invasive surgery Breast and Colorectal Surgery  05/11/2014, 5:40 AM

## 2014-06-10 ENCOUNTER — Encounter (INDEPENDENT_AMBULATORY_CARE_PROVIDER_SITE_OTHER): Payer: 59 | Admitting: General Surgery

## 2014-06-24 ENCOUNTER — Encounter (INDEPENDENT_AMBULATORY_CARE_PROVIDER_SITE_OTHER): Payer: Self-pay | Admitting: General Surgery

## 2014-06-24 ENCOUNTER — Ambulatory Visit (INDEPENDENT_AMBULATORY_CARE_PROVIDER_SITE_OTHER): Payer: 59 | Admitting: General Surgery

## 2014-06-24 VITALS — BP 132/78 | HR 78 | Temp 98.0°F | Resp 18 | Ht 71.0 in | Wt 283.0 lb

## 2014-06-24 DIAGNOSIS — K3533 Acute appendicitis with perforation and localized peritonitis, with abscess: Secondary | ICD-10-CM

## 2014-06-24 DIAGNOSIS — K353 Acute appendicitis with localized peritonitis, without perforation or gangrene: Secondary | ICD-10-CM

## 2014-06-24 NOTE — Progress Notes (Signed)
Patient ID: Craig SaltsDavid Clark, male   DOB: 1986/04/08, 28 y.o.   MRN: [id number]  History: This gentleman underwent laparoscopic appendectomy for histologically confirmed acute appendicitis on 05/10/2014. He is now recovered and is asymptomatic. Normal appetite. Normal bowel function. No wound  problems  Exam:  pleasant gentleman. No distress Abdomen soft and nontender. All trocar sites healing normally. No hernia or fluid collection  Assessment:  acute appendicitis with localized peritonitis, uneventful recovery following laparoscopic appendectomy  Plan:   resume normal activities without restriction Return as needed.   Angelia MouldHaywood M. Derrell LollingIngram, M.D., [facility] General and Minimally invasive Surgery Breast and Colorectal Surgery Office:   [phone] Pager:   [phone]

## 2014-06-24 NOTE — Patient Instructions (Signed)
You have recovered from your laparoscopic appendectomy without any surgical complications.  Your final pathology report showed acute appendicitis. There was no tumor or malignancy.  You may resume normal activities without restriction  Return to see Dr. Derrell LollingIngram if necessary.

## 2014-12-04 IMAGING — CT CT ABD-PELV W/O CM
2 of 3 series · 17 of 40 positions shown, 19 images · non-contrast
Comparison: CT 10/10/2010.

CLINICAL DATA: Flank pain.

EXAM:
CT ABDOMEN AND PELVIS WITHOUT CONTRAST
TECHNIQUE: Multidetector CT imaging of the abdomen and pelvis was performed
following the standard protocol without IV contrast.

[Series 4: lung windows · axial · 0.93mm/px · z∈[-294,-180]mm · 14 of 26 slices shown, 16 images]
[im 2/26  soft-tissue]
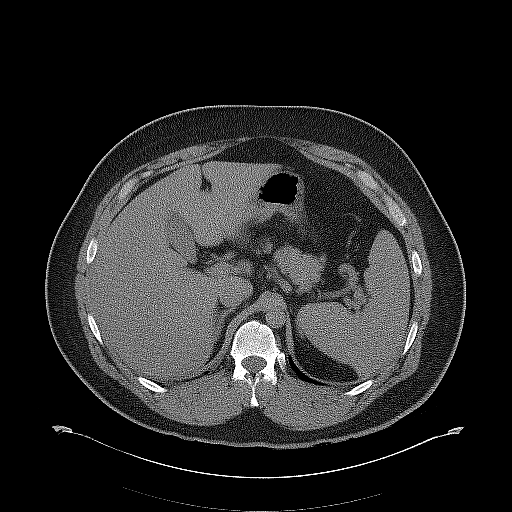
[im 2/26  bone]
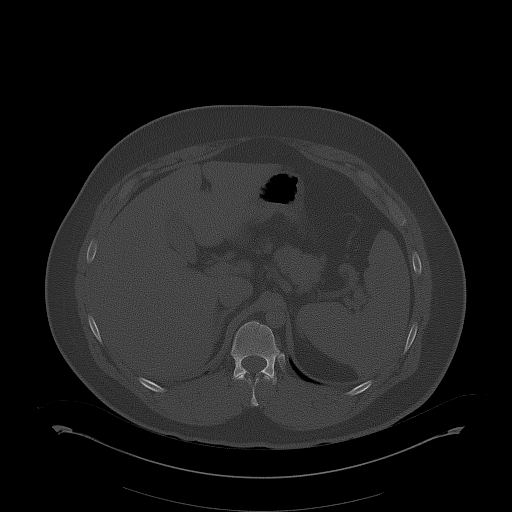
[im 4/26  soft-tissue]
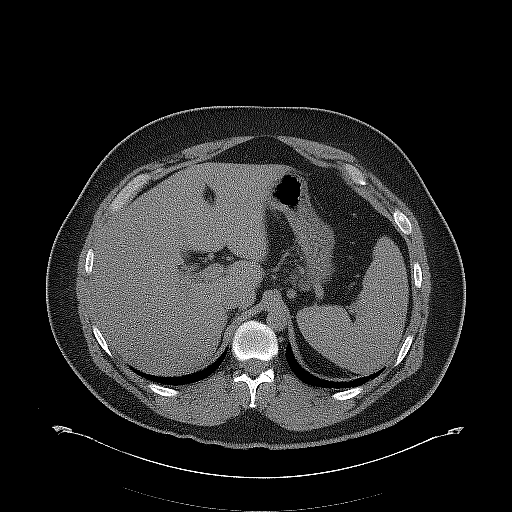
[im 6/26  soft-tissue]
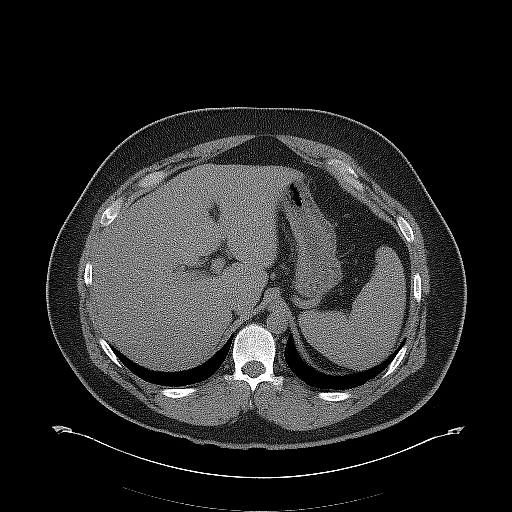
[im 8/26  soft-tissue]
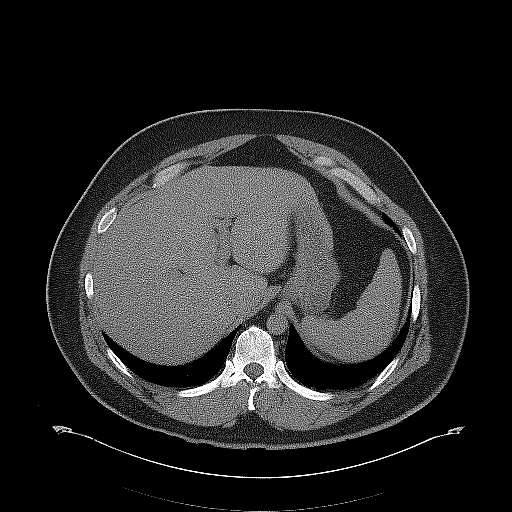
[im 9/26  soft-tissue]
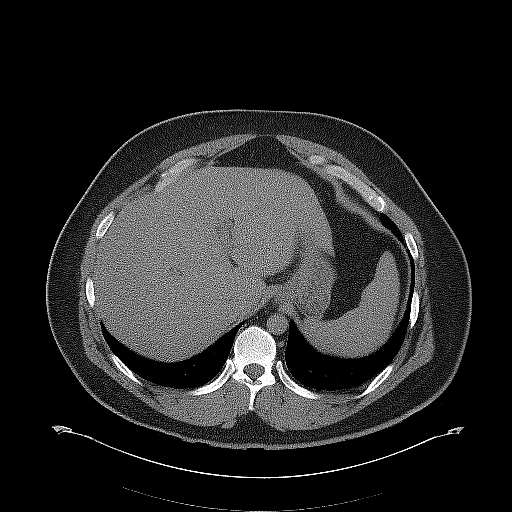
[im 11/26  soft-tissue]
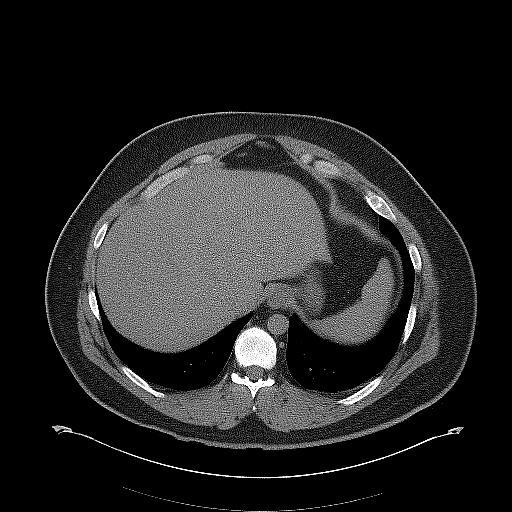
[im 13/26  soft-tissue]
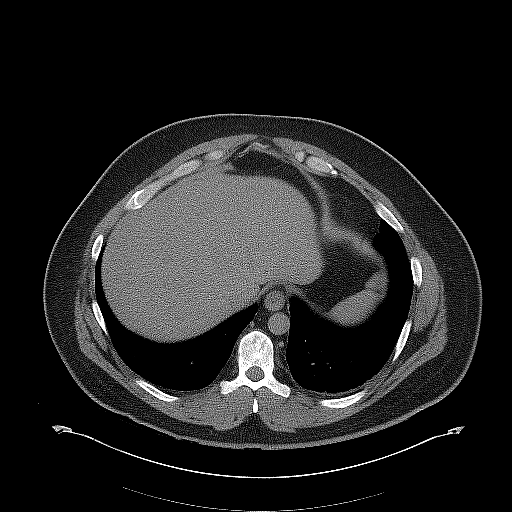
[im 14/26  soft-tissue]
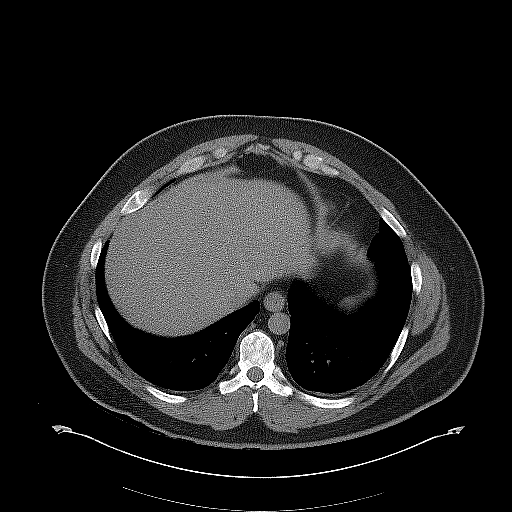
[im 16/26  soft-tissue]
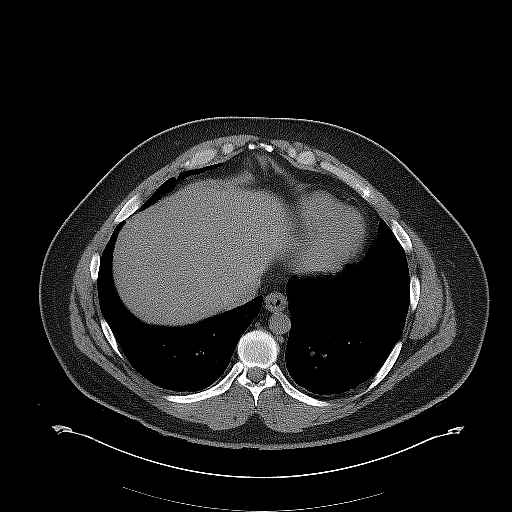
[im 16/26  bone]
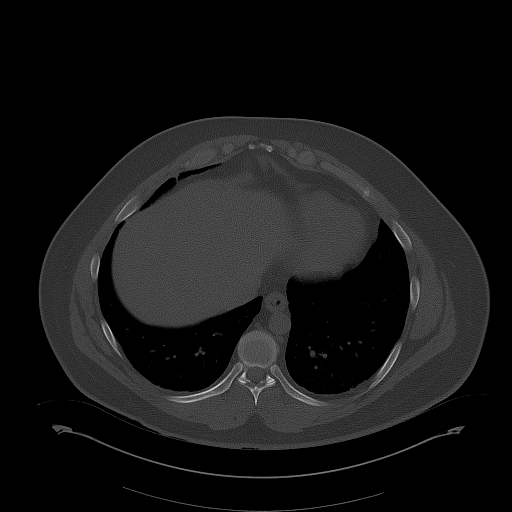
[im 18/26  soft-tissue]
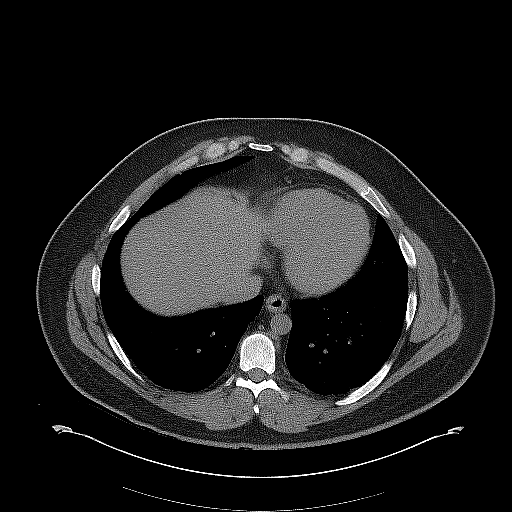
[im 20/26  soft-tissue]
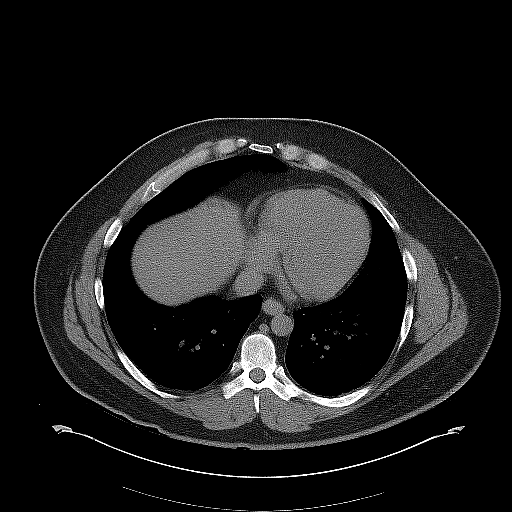
[im 21/26  soft-tissue]
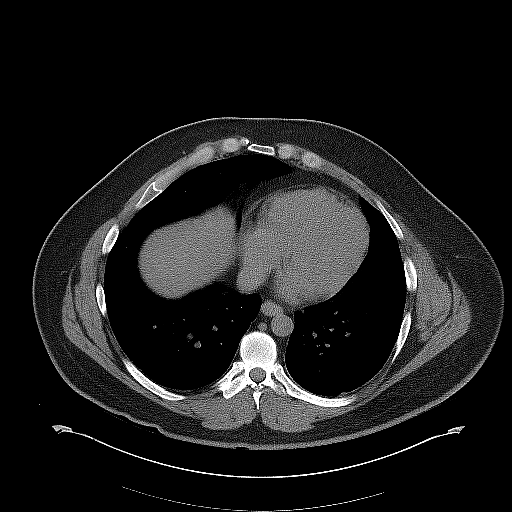
[im 23/26  soft-tissue]
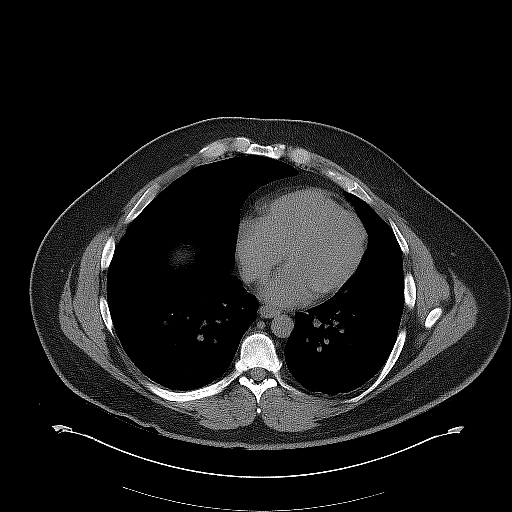
[im 25/26  soft-tissue]
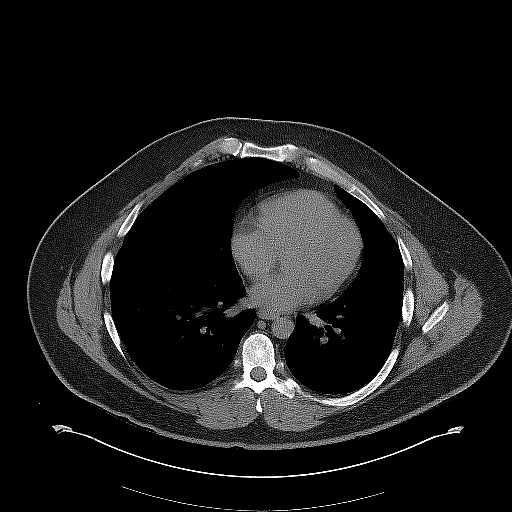

[Series 602: <mpr thick range> · coronal · 0.97mm/px · 3 of 98 slices shown]
[im 33/98  soft-tissue]
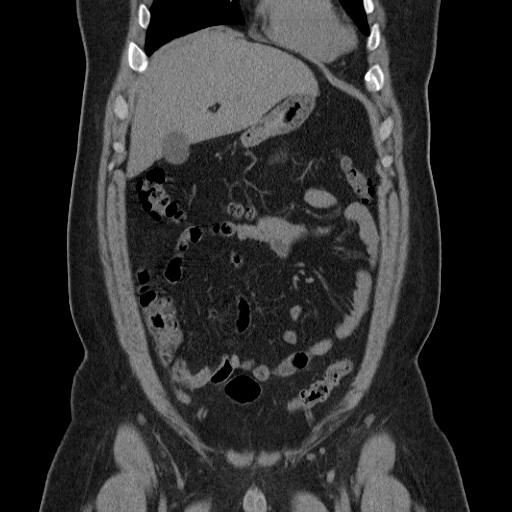
[im 44/98  soft-tissue]
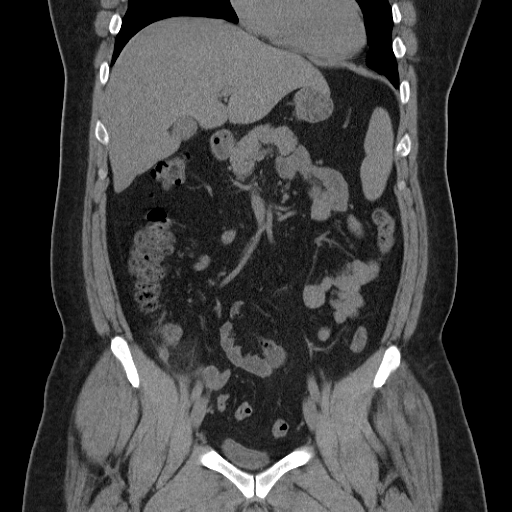
[im 54/98  soft-tissue]
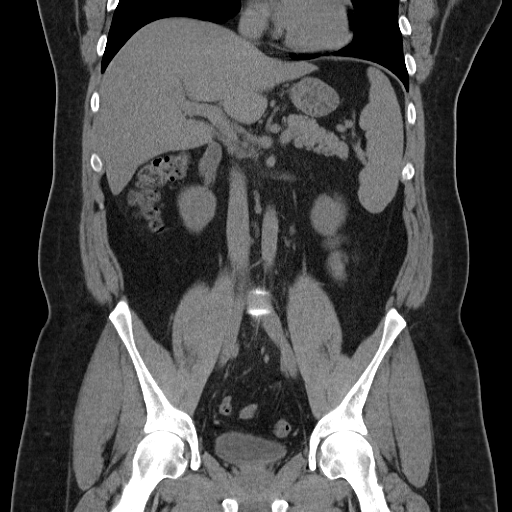

[17 of 40 positions shown; findings below may reference images not displayed]

FINDINGS: Liver normal. Spleen normal. Pancreas normal. No biliary distention.
The gallbladder is nondistended.

Adrenals normal. Kidneys normal. No hydronephrosis or obstructing
ureteral stone. Bladder is nondistended. Prostate is not enlarged.

No significant adenopathy.  Abdominal aorta normal caliber.

Appendix is distended to 1.3 cm. Periappendiceal inflammatory change
present. These findings are consistent with appendicitis. No
evidence of bowel obstruction. No free air. No mesenteric mass. No
significant hernia. Small inguinal hernias with herniation of fat
only noted.

Heart size normal. Mild atelectasis left lung base. No acute bony
abnormality.
IMPRESSION: Appendicitis.
